# Patient Record
Sex: Male | Born: 2000 | Race: White | Hispanic: No | Marital: Single | Smoking: Never smoker
Health system: Southern US, Community
[De-identification: ages and names within clinical notes are randomized; demographics above are authoritative.]

## PROBLEM LIST (undated history)

## (undated) DIAGNOSIS — Q6689 Other  specified congenital deformities of feet: Secondary | ICD-10-CM

## (undated) DIAGNOSIS — J302 Other seasonal allergic rhinitis: Secondary | ICD-10-CM

## (undated) HISTORY — PX: MYRINGOTOMY: SUR874

---
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
## 2018-06-25 DIAGNOSIS — Q6689 Other  specified congenital deformities of feet: Secondary | ICD-10-CM | POA: Diagnosis not present

## 2018-06-25 DIAGNOSIS — M79672 Pain in left foot: Secondary | ICD-10-CM | POA: Diagnosis not present

## 2018-07-25 DIAGNOSIS — M79672 Pain in left foot: Secondary | ICD-10-CM | POA: Diagnosis not present

## 2018-08-01 IMAGING — CT CT FOOT*L* W/O CM
4 of 5 series · 10 of 20 positions shown, 11 images · non-contrast
Comparison: CT left foot dated December 15, 2015.

CLINICAL DATA: Chronic left foot pain. History of calcaneonavicular
coalition status post 2 excisions.

EXAM:
CT OF THE LEFT FOOT WITHOUT CONTRAST
TECHNIQUE: Multidetector CT imaging of the left foot was performed according to
the standard protocol. Multiplanar CT image reconstructions were
also generated.

[Series 5: lower ext soft · axial · 0.67mm/px · z∈[-164,-104]mm · 2 of 74 slices shown]
[im 25/74  soft-tissue]
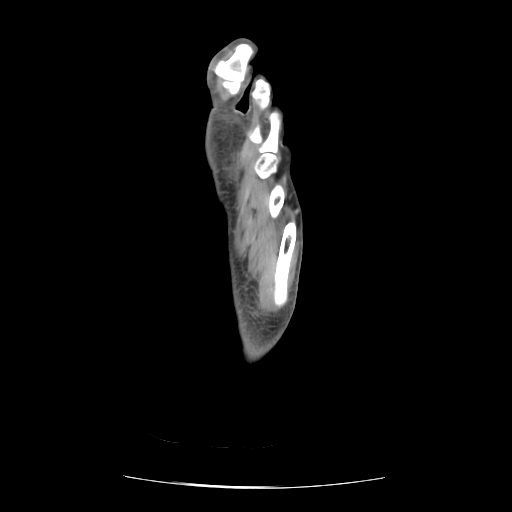
[im 49/74  soft-tissue]
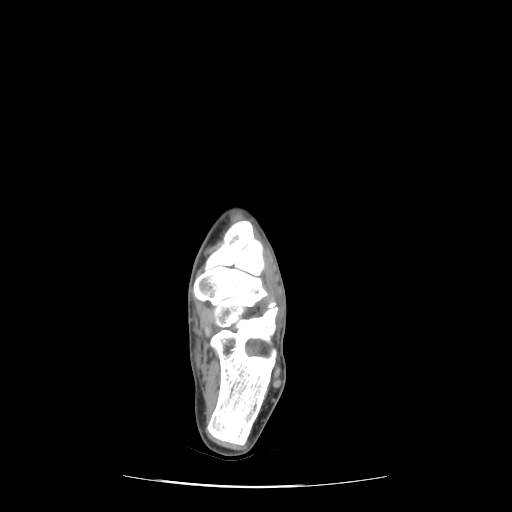

[Series 202: cor bone 2 · axial · 0.67mm/px · z∈[-252,+38]mm · 3 of 77 slices shown, 4 images]
[im 1/77  soft-tissue]
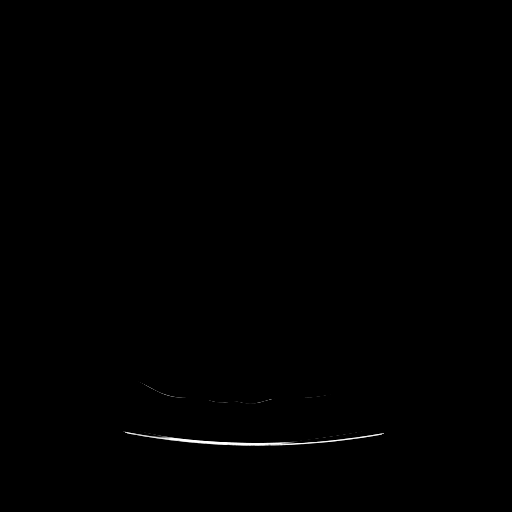
[im 1/77  bone]
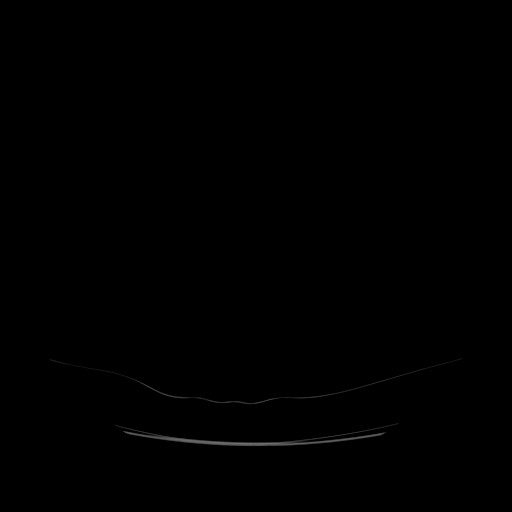
[im 39/77  bone]
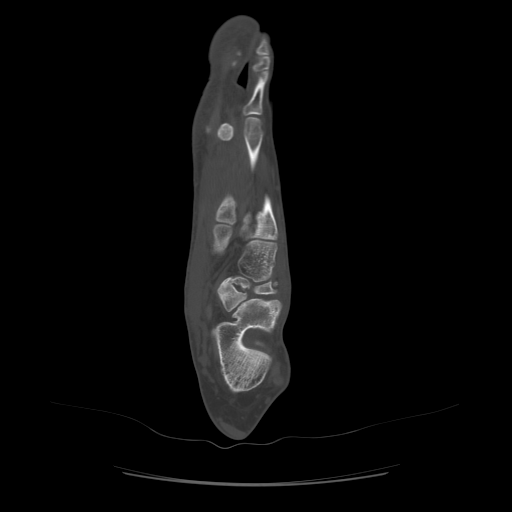
[im 77/77  bone]
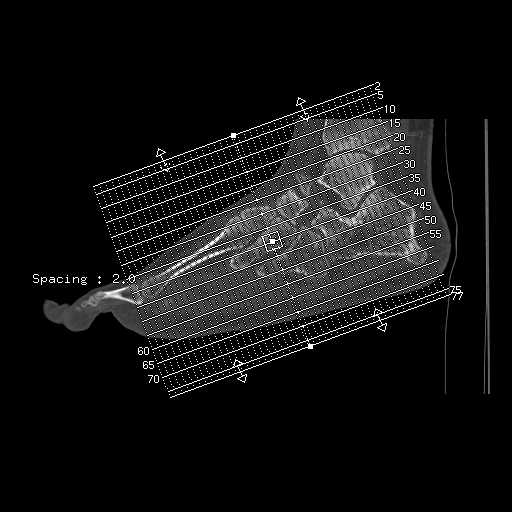

[Series 204: axial · coronal · 0.67mm/px · 3 of 126 slices shown]
[im 46/126  bone]
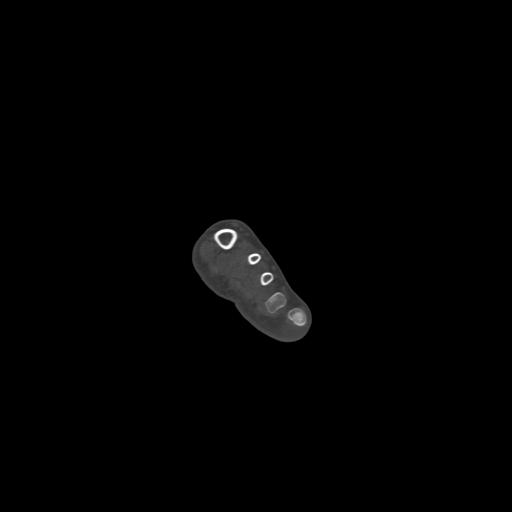
[im 61/126  bone]
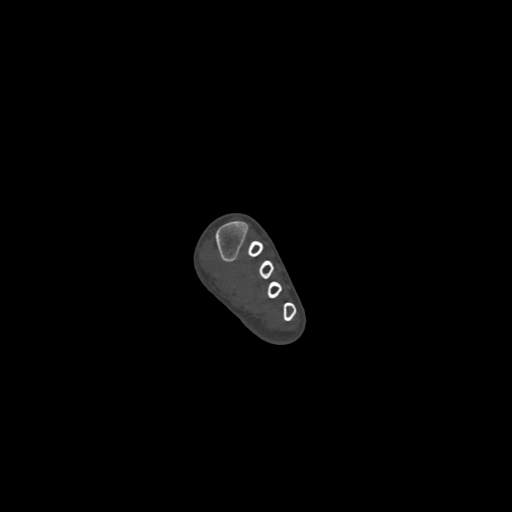
[im 77/126  bone]
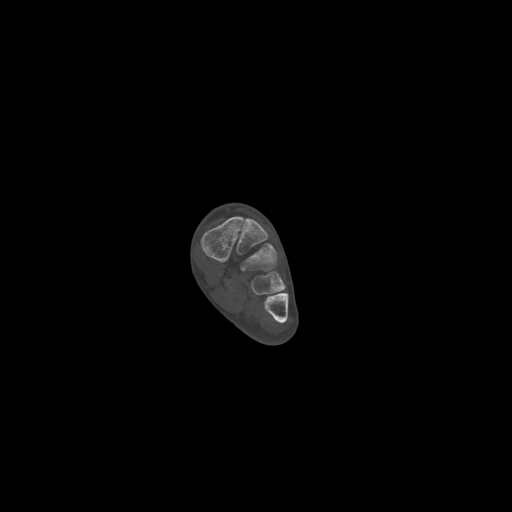

[Series 300: sag soft · sagittal · 0.67mm/px · 2 of 99 slices shown]
[im 33/99  soft-tissue]
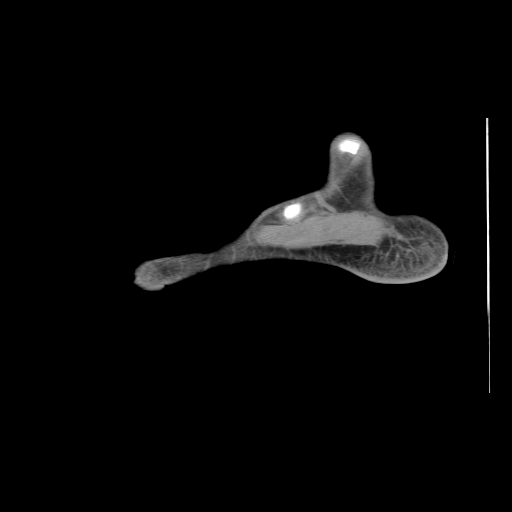
[im 66/99  soft-tissue]
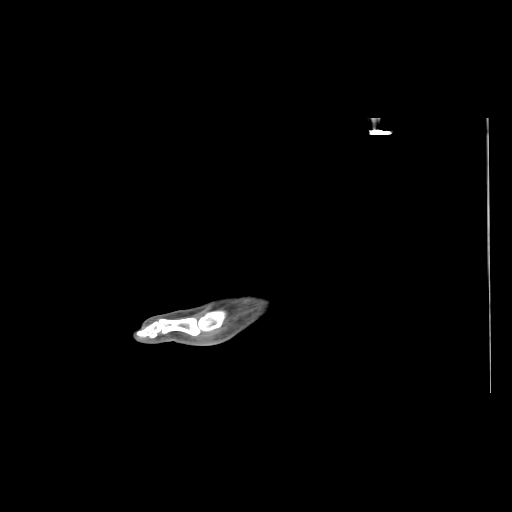

[10 of 20 positions shown; findings below may reference images not displayed]

FINDINGS: Bones/Joint/Cartilage

There is persistent abnormal articulation of the lateral navicular
with the anterior process of the calcaneus, without significant
irregularity at the margin of the articulation. No fracture or
dislocation. Normal alignment. No joint effusion.

Ligaments

Ligaments are suboptimally evaluated by CT.

Muscles and Tendons
Unremarkable.

Soft tissue
No fluid collection or hematoma.  No soft tissue mass.
IMPRESSION: 1. Persistent abnormal articulation of the lateral navicular and
anterior process of the calcaneus, concerning for recurrent
fibro-osseous coalition.

## 2019-02-13 ENCOUNTER — Other Ambulatory Visit: Payer: Self-pay | Admitting: *Deleted

## 2019-02-13 DIAGNOSIS — Z20822 Contact with and (suspected) exposure to covid-19: Secondary | ICD-10-CM

## 2019-02-15 ENCOUNTER — Telehealth: Payer: Self-pay | Admitting: Family Medicine

## 2019-02-15 LAB — NOVEL CORONAVIRUS, NAA: SARS-CoV-2, NAA: NOT DETECTED

## 2019-02-15 NOTE — Telephone Encounter (Signed)
Patient informed of negative covid result.  °

## 2019-05-01 ENCOUNTER — Encounter: Payer: Self-pay | Admitting: Family Medicine

## 2019-05-02 ENCOUNTER — Encounter: Payer: Self-pay | Admitting: Family Medicine

## 2020-11-05 ENCOUNTER — Other Ambulatory Visit (HOSPITAL_COMMUNITY): Payer: Self-pay

## 2020-11-05 MED ORDER — CARESTART COVID-19 HOME TEST VI KIT
PACK | 0 refills | Status: DC
  Filled 2020-11-05: qty 4, 4d supply, fill #0

## 2020-11-16 ENCOUNTER — Other Ambulatory Visit (HOSPITAL_COMMUNITY): Payer: Self-pay

## 2021-04-22 ENCOUNTER — Other Ambulatory Visit (HOSPITAL_COMMUNITY): Payer: Self-pay

## 2021-04-22 MED ORDER — CARESTART COVID-19 HOME TEST VI KIT
PACK | 0 refills | Status: DC
  Filled 2021-04-22: qty 4, 4d supply, fill #0

## 2021-11-23 ENCOUNTER — Ambulatory Visit: Payer: 59 | Admitting: Family Medicine

## 2021-11-23 ENCOUNTER — Encounter: Payer: Self-pay | Admitting: Family Medicine

## 2021-11-23 VITALS — BP 128/80 | HR 64 | Temp 97.9°F | Ht 74.0 in | Wt 184.4 lb

## 2021-11-23 DIAGNOSIS — S91332A Puncture wound without foreign body, left foot, initial encounter: Secondary | ICD-10-CM | POA: Diagnosis not present

## 2021-11-23 DIAGNOSIS — Z23 Encounter for immunization: Secondary | ICD-10-CM

## 2021-11-23 DIAGNOSIS — S99929A Unspecified injury of unspecified foot, initial encounter: Secondary | ICD-10-CM

## 2021-11-23 DIAGNOSIS — T7840XA Allergy, unspecified, initial encounter: Secondary | ICD-10-CM

## 2021-11-23 NOTE — Patient Instructions (Signed)
Referral to allergy placed.  Follow up annually.  Work on quitting the vape.  Take care  Dr. Adriana Simas

## 2021-11-24 DIAGNOSIS — S99929A Unspecified injury of unspecified foot, initial encounter: Secondary | ICD-10-CM | POA: Insufficient documentation

## 2021-11-24 DIAGNOSIS — T7840XA Allergy, unspecified, initial encounter: Secondary | ICD-10-CM | POA: Insufficient documentation

## 2021-11-24 NOTE — Assessment & Plan Note (Signed)
Patient concern for food allergy.  He states that he has intermittent rash but cannot find a clear trigger.  Desires to see allergist for allergy testing.  Referral placed.

## 2021-11-24 NOTE — Assessment & Plan Note (Signed)
Tdap given today.

## 2021-11-24 NOTE — Progress Notes (Addendum)
Subjective:  Patient ID: Brandon Munoz, male    DOB: 2001-01-27  Age: 21 y.o. MRN: [id number]  CC: Chief Complaint  Patient presents with   Establish Care    Pt needing tetanus shot due to stepping on nail a few weeks ago.     HPI:  21 year old male presents to establish care.  Patient states that overall he is doing well.  He did recently stepped on a nail approximately 3 weeks ago (8/2). Injured left foot.  He is requesting a tetanus vaccination.  Patient also reports that he has intermittent rash that comes and goes.  He is concerned that this is related to food intake.  He is unsure of the underlying culprit.  Would like to discuss possible referral to an allergist.  Patient Active Problem List   Diagnosis Date Noted   Puncture wound of left foot 11/26/2021   Allergies 11/24/2021    Social Hx   Social History   Socioeconomic History   Marital status: Single    Spouse name: Not on file   Number of children: Not on file   Years of education: Not on file   Highest education level: Not on file  Occupational History   Not on file  Tobacco Use   Smoking status: Never   Smokeless tobacco: Never  Substance and Sexual Activity   Alcohol use: No   Drug use: No   Sexual activity: Not on file  Other Topics Concern   Not on file  Social History Narrative   Not on file   Social Determinants of Health   Financial Resource Strain: Not on file  Food Insecurity: Not on file  Transportation Needs: Not on file  Physical Activity: Not on file  Stress: Not on file  Social Connections: Not on file    Review of Systems Per HPI  Objective:  BP 128/80   Pulse 64   Temp 97.9 F (36.6 C)   Ht 6\' 2"  (1.88 m)   Wt 184 lb 6.4 oz (83.6 kg)   SpO2 100%   BMI 23.68 kg/m      11/23/2021    2:20 PM 02/18/2016    4:26 PM 02/18/2016    3:45 PM  BP/Weight  Systolic BP 128 111 118  Diastolic BP 80 66 67  Wt. (Lbs) 184.4    BMI 23.68 kg/m2      Physical  Exam Constitutional:      General: He is not in acute distress.    Appearance: Normal appearance.  HENT:     Head: Normocephalic and atraumatic.  Eyes:     General:        Right eye: No discharge.        Left eye: No discharge.     Conjunctiva/sclera: Conjunctivae normal.  Cardiovascular:     Rate and Rhythm: Normal rate and regular rhythm.  Pulmonary:     Effort: Pulmonary effort is normal.     Breath sounds: Normal breath sounds. No wheezing, rhonchi or rales.  Skin:    Findings: No rash.  Neurological:     Mental Status: He is alert.  Psychiatric:        Mood and Affect: Mood normal.        Behavior: Behavior normal.      Assessment & Plan:   Problem List Items Addressed This Visit       Other   Allergies - Primary    Patient concern for food allergy.  He states  that he has intermittent rash but cannot find a clear trigger.  Desires to see allergist for allergy testing.  Referral placed.      Relevant Orders   Ambulatory referral to Allergy   Puncture wound of left foot   Relevant Orders   Tdap vaccine greater than or equal to 7yo IM (Completed)    Follow-up: Annually  Everlene Other DO [facility]

## 2021-11-26 DIAGNOSIS — S91332A Puncture wound without foreign body, left foot, initial encounter: Secondary | ICD-10-CM | POA: Insufficient documentation

## 2022-05-25 DIAGNOSIS — M79672 Pain in left foot: Secondary | ICD-10-CM | POA: Diagnosis not present

## 2022-05-25 DIAGNOSIS — M19072 Primary osteoarthritis, left ankle and foot: Secondary | ICD-10-CM | POA: Diagnosis not present
